# Patient Record
Sex: Female | Born: 1995 | Hispanic: Refuse to answer | Marital: Single | Smoking: Never smoker
Health system: Southern US, Community
[De-identification: ages and names within clinical notes are randomized; demographics above are authoritative.]

---
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
## 2017-02-18 ENCOUNTER — Ambulatory Visit (INDEPENDENT_AMBULATORY_CARE_PROVIDER_SITE_OTHER): Payer: Managed Care, Other (non HMO) | Admitting: Family Medicine

## 2017-02-18 ENCOUNTER — Encounter: Payer: Self-pay | Admitting: Family Medicine

## 2017-02-18 DIAGNOSIS — M79605 Pain in left leg: Secondary | ICD-10-CM

## 2017-02-26 ENCOUNTER — Other Ambulatory Visit: Payer: Self-pay | Admitting: Family Medicine

## 2017-02-26 DIAGNOSIS — M79605 Pain in left leg: Secondary | ICD-10-CM

## 2017-02-27 ENCOUNTER — Ambulatory Visit
Admission: RE | Admit: 2017-02-27 | Discharge: 2017-02-27 | Disposition: A | Discharge status: Home / Self Care | Payer: Commercial Managed Care - PPO | Source: Ambulatory Visit | Attending: Family Medicine | Admitting: Family Medicine

## 2017-02-27 DIAGNOSIS — M79605 Pain in left leg: Secondary | ICD-10-CM | POA: Diagnosis present

## 2017-02-27 DIAGNOSIS — Z9889 Other specified postprocedural states: Secondary | ICD-10-CM | POA: Diagnosis not present

## 2017-03-07 ENCOUNTER — Other Ambulatory Visit: Payer: Self-pay | Admitting: Family Medicine

## 2017-03-07 DIAGNOSIS — M79605 Pain in left leg: Secondary | ICD-10-CM

## 2017-03-08 ENCOUNTER — Ambulatory Visit
Admission: RE | Admit: 2017-03-08 | Discharge: 2017-03-08 | Disposition: A | Discharge status: Home / Self Care | Payer: Commercial Managed Care - PPO | Source: Ambulatory Visit | Attending: Family Medicine | Admitting: Family Medicine

## 2017-03-08 DIAGNOSIS — M79605 Pain in left leg: Secondary | ICD-10-CM | POA: Diagnosis present

## 2017-03-08 DIAGNOSIS — M25761 Osteophyte, right knee: Secondary | ICD-10-CM | POA: Diagnosis not present

## 2017-03-15 NOTE — Progress Notes (Signed)
Patient presents today with symptoms of pain along her surgical scar on her lower left leg from her anterior cruciate ligament surgery. Patient states that she has had pain for the last few days. She denies falling or injuring the area in any way. She states that the pain starts along the surgical scar and then goes down laterally to the mid lower leg area. She denies the symptoms going down into her foot. She denies any swelling of the lower leg or discoloration of the leg. She states that she feels the symptoms at rest as well as with activity. It does get worse when she does activity though. She denies any history of any stress injury in the past. She denies any lower back pain or thigh pain. She is unsure whether the anterior cruciate ligament brace she is wearing is contributing to her symptoms. The symptoms last for less than a minute and then go away and come back.  ROS: Negative except mentioned above. Vitals as per Epic. GENERAL: NAD RESP: CTA B CARD: RRR MSK: Left lower extremity - no obvious deformity, normal color, or surgical scar is raised and nontender, tapping on the scar causes pain along the lateral lower leg, full range of motion, negative Homans, positive pedal pulses NEURO: CN II-XII grossly intact   A/P: Left lower leg pain - symptoms suggestive of superficial nerve pain, unsure whether her brace is causing compression of the area, would recommend adjusting the brace to see if this resolves symptoms, will get x-rays of the lower leg, would consider using lidocaine patch or gel, will follow up with Dr. Ardine Engiehl when he is at [facility] next. Activity as tolerated. Follow-up if symptoms worsen as discussed.

## 2017-04-22 ENCOUNTER — Encounter: Payer: Self-pay | Admitting: Family Medicine

## 2017-04-22 ENCOUNTER — Ambulatory Visit (INDEPENDENT_AMBULATORY_CARE_PROVIDER_SITE_OTHER): Payer: PRIVATE HEALTH INSURANCE | Admitting: Family Medicine

## 2017-04-22 DIAGNOSIS — M79672 Pain in left foot: Secondary | ICD-10-CM

## 2017-04-22 NOTE — Progress Notes (Signed)
Patient presents today with symptoms of left foot pain for the past 4 weeks. Patient states that she noticed some pain on the dorsal aspect of her foot with some bruising and swelling approximately 4 weeks ago. She is unsure of any trauma to the foot at that time. She was able to practice and also play in a basketball games without any noticeable issues. She did not disclose any pain of the foot until after the season was over to her trainer. She states since basketball ended a few days ago her symptoms have improved. She denies any pain anywhere else. She denies any history of injury to that foot in the past. She does not wear orthotics. She does not run outside of normal basketball practice. Denies any significant weight changes or menstrual issues.she denies any pain with walking at this time.  ROS: Negative except mentioned above. Vitals as per Epic. GENERAL: NAD MSK: Left Foot - no obvious ecchymosis or swelling, mild tenderness along the dorsal aspect of the proximal fourth metatarsal area, full range of motion, 5 out of 5 strength, NV intact NEURO: CN II-XII grossly intact   A/P: Left Foot Pain - patient has no pain currently with walking, will do x-rays, encourage patient to rest from running at this time, patient has approximately 2 weeks off from basketball activity, if her x-rays are negative for any bony abnormality would suggest further imaging if her pain comes back when she returns to basketball activity in a few weeks, consider also doing vitamin D level.

## 2017-04-23 ENCOUNTER — Ambulatory Visit
Admission: RE | Admit: 2017-04-23 | Discharge: 2017-04-23 | Disposition: A | Discharge status: Home / Self Care | Payer: Commercial Managed Care - PPO | Source: Ambulatory Visit | Attending: Family Medicine | Admitting: Family Medicine

## 2017-04-23 DIAGNOSIS — M79672 Pain in left foot: Secondary | ICD-10-CM | POA: Diagnosis present

## 2017-12-11 IMAGING — CR DG KNEE COMPLETE 4+V*L*
1 series · 4 of 4 positions shown · non-contrast
Comparison: None in PACs

CLINICAL DATA: Left knee buckle during a basketball game last
night. The patient is experiencing pain and tightness posteriorly
and medially. It is painful to bear weight.

EXAM:
LEFT KNEE - COMPLETE 4+ VIEW

[Series 1: dg knee complete 4 views left · 0.14mm/px · 4 of 4 slices shown]
[im 1/4]
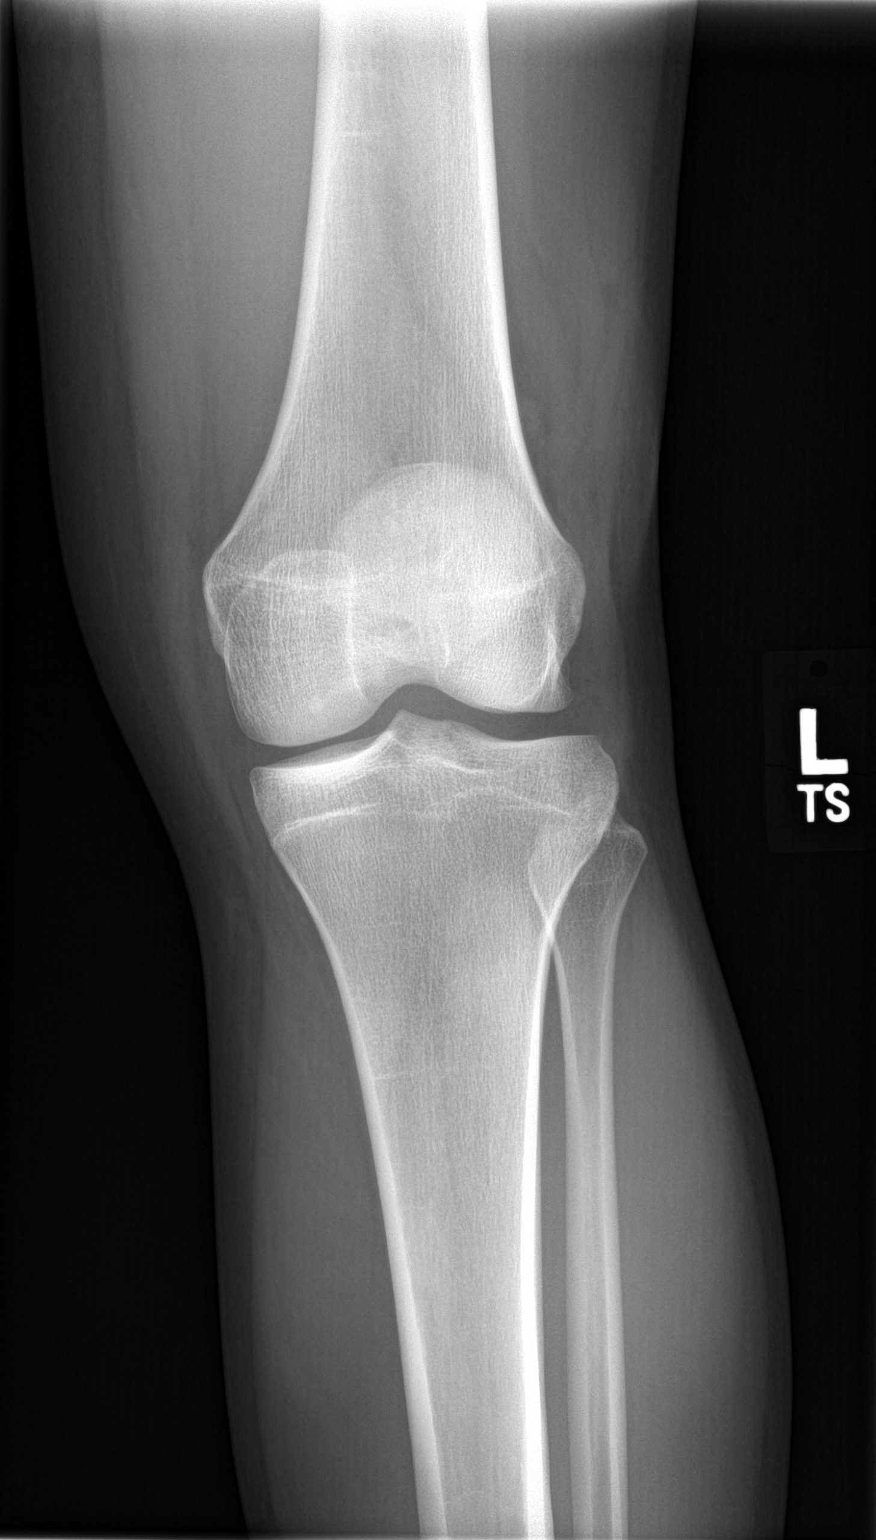
[im 2/4]
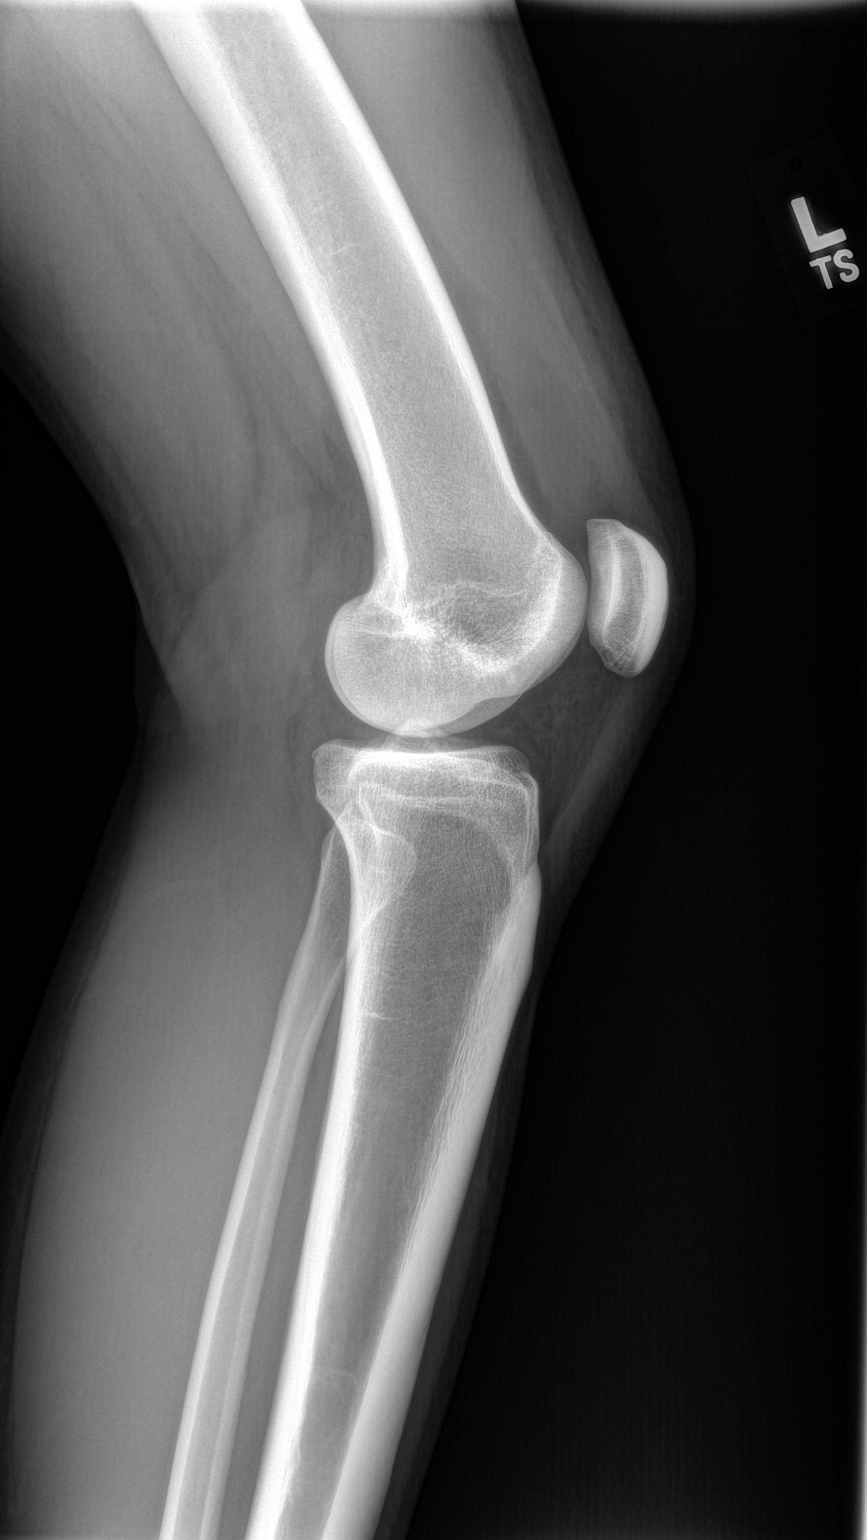
[im 3/4]
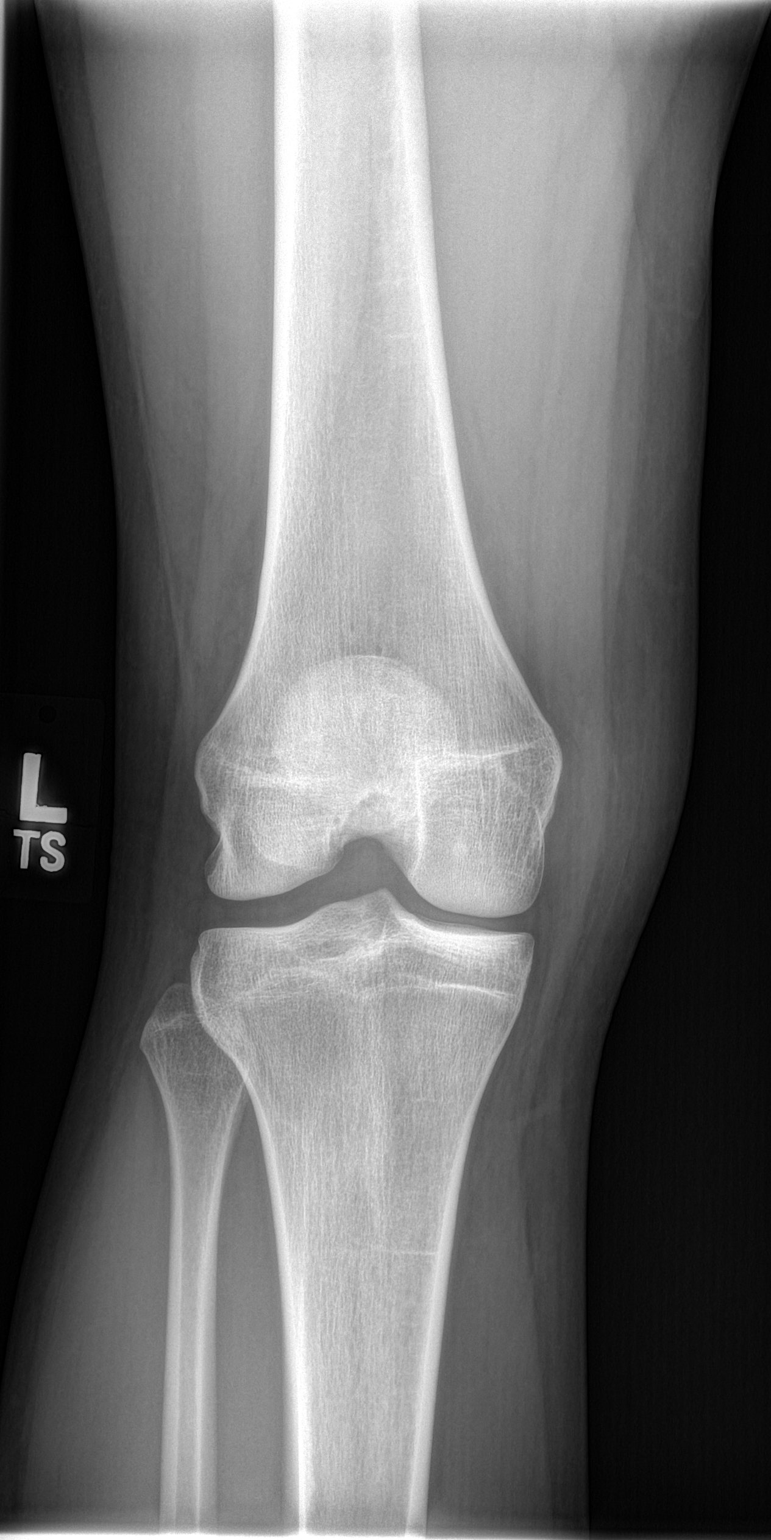
[im 4/4]
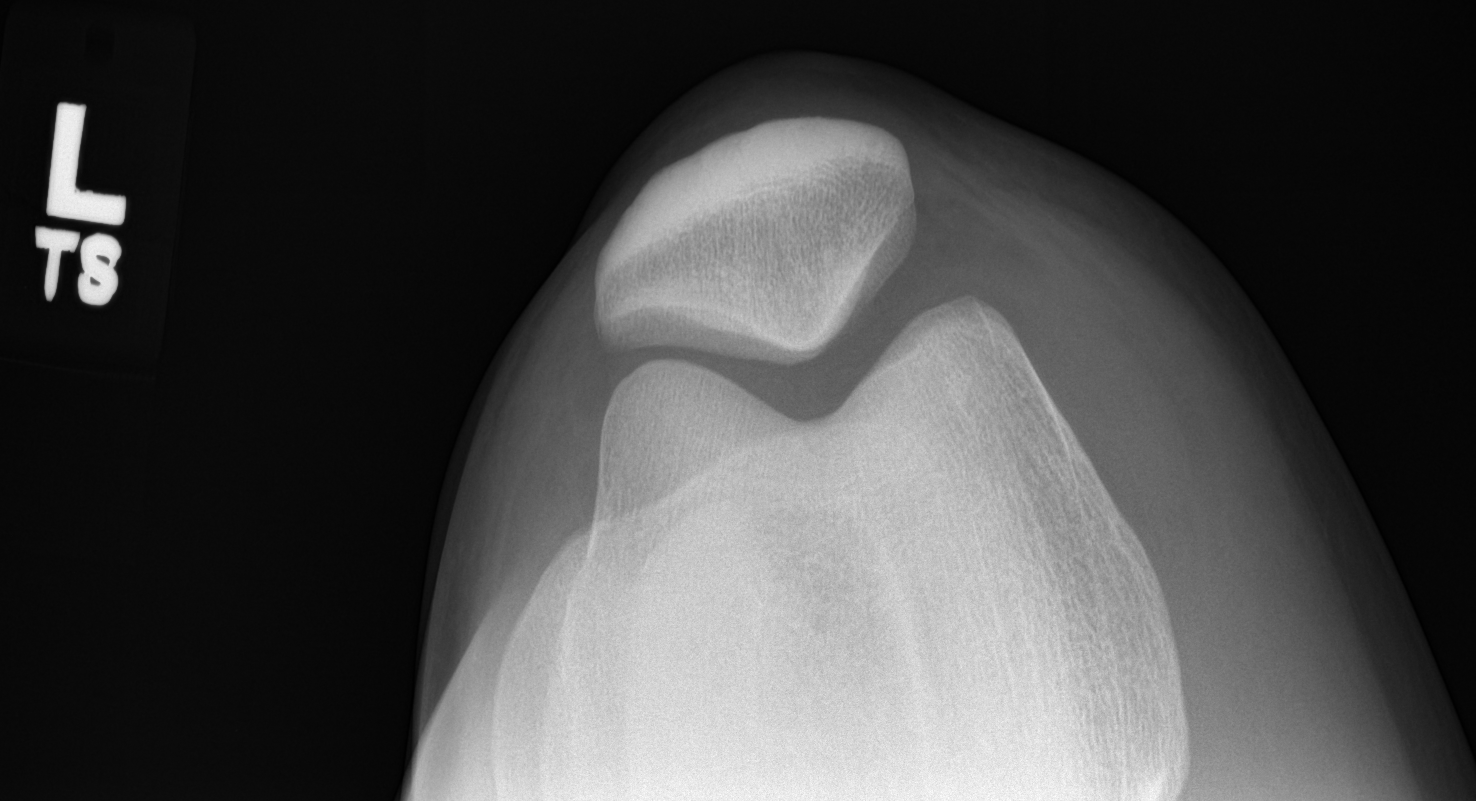

[4 of 4 positions shown; findings below may reference images not displayed]

FINDINGS: The bones are subjectively adequately mineralized. There is no acute
fracture or dislocation. There is likely a suprapatellar effusion.
The joint spaces are well maintained.
IMPRESSION: There is no acute fracture nor dislocation of the left knee. There
may be a small suprapatellar effusion.

## 2018-03-21 ENCOUNTER — Encounter: Payer: Self-pay | Admitting: Family Medicine

## 2018-03-21 ENCOUNTER — Ambulatory Visit (INDEPENDENT_AMBULATORY_CARE_PROVIDER_SITE_OTHER): Payer: Commercial Managed Care - PPO | Admitting: Family Medicine

## 2018-03-21 VITALS — BP 135/79 | HR 87 | Temp 97.9°F | Resp 14

## 2018-03-21 DIAGNOSIS — J029 Acute pharyngitis, unspecified: Secondary | ICD-10-CM

## 2018-03-21 LAB — CBC WITH DIFFERENTIAL/PLATELET
BASOS ABS: 0 10*3/uL (ref 0.0–0.2)
Basos: 0 %
EOS (ABSOLUTE): 0 10*3/uL (ref 0.0–0.4)
Eos: 0 %
Hematocrit: 38.2 % (ref 34.0–46.6)
Hemoglobin: 13.5 g/dL (ref 11.1–15.9)
Immature Grans (Abs): 0 10*3/uL (ref 0.0–0.1)
Immature Granulocytes: 0 %
LYMPHS ABS: 1.6 10*3/uL (ref 0.7–3.1)
Lymphs: 17 %
MCH: 31.9 pg (ref 26.6–33.0)
MCHC: 35.3 g/dL (ref 31.5–35.7)
MCV: 90 fL (ref 79–97)
Monocytes Absolute: 0.9 10*3/uL (ref 0.1–0.9)
Monocytes: 10 %
NEUTROS ABS: 6.5 10*3/uL (ref 1.4–7.0)
Neutrophils: 73 %
PLATELETS: 236 10*3/uL (ref 150–450)
RBC: 4.23 x10E6/uL (ref 3.77–5.28)
RDW: 11.5 % — ABNORMAL LOW (ref 11.7–15.4)
WBC: 9.1 10*3/uL (ref 3.4–10.8)

## 2018-03-21 LAB — POCT RAPID STREP A (OFFICE): Rapid Strep A Screen: NEGATIVE

## 2018-03-21 LAB — POCT INFLUENZA A/B
INFLUENZA B, POC: NEGATIVE
Influenza A, POC: NEGATIVE

## 2018-03-21 LAB — MONONUCLEOSIS SCREEN: MONO SCREEN: NEGATIVE

## 2018-03-21 NOTE — Progress Notes (Signed)
negative

## 2018-03-21 NOTE — Progress Notes (Signed)
Patient presents today with symptoms of sore throat for two days and night sweats last night.  Patient states that she woke up around 4 AM and took Aleve.  She denies any cough, abdominal pain, nausea, vomiting, diarrhea.  Patient does admit to some postnasal drip.  She denies ever having infectious mono.  She denies any sick contacts.  ROS: Negative except mentioned above. Vitals as per Epic. GENERAL: NAD HEENT: moderate pharyngeal erythema, no exudate, mild enlargement of tonsils, no erythema of TMs, shotty cervical LAD RESP: CTA B CARD: RRR ABD:, Nontender, no rebound or guarding appreciated, no organomegaly appreciated NEURO: CN II-XII grossly intact   A/P: Pharyngitis- rapid strep test and flu screen were negative in the office, CBC and Monospot test were drawn, continue NSAID/Tylenol as needed, ice, hydration, no athletic activity or class until afebrile for 24 hours without taking fever lowering medication.  Will discuss any change in treatment after lab results have been reviewed.  Athletic trainer was informed.

## 2018-08-08 ENCOUNTER — Other Ambulatory Visit: Payer: Self-pay | Admitting: *Deleted

## 2018-08-08 DIAGNOSIS — Z20822 Contact with and (suspected) exposure to covid-19: Secondary | ICD-10-CM

## 2018-08-11 ENCOUNTER — Other Ambulatory Visit: Payer: Self-pay

## 2018-08-11 DIAGNOSIS — Z20822 Contact with and (suspected) exposure to covid-19: Secondary | ICD-10-CM

## 2018-08-16 LAB — NOVEL CORONAVIRUS, NAA: SARS-CoV-2, NAA: NOT DETECTED

## 2018-08-20 ENCOUNTER — Other Ambulatory Visit: Payer: Self-pay | Admitting: Family Medicine

## 2018-08-23 LAB — 5+CRT-BUND
Amphetamines, Urine: NEGATIVE ng/mL
Cannabinoid Quant, Ur: NEGATIVE ng/mL
Cocaine (Metab.): NEGATIVE ng/mL
Creatinine, Urine: 34.5 mg/dL (ref 20.0–300.0)
OPIATE QUANTITATIVE URINE: NEGATIVE ng/mL
PCP Quant, Ur: NEGATIVE ng/mL
pH, Urine: 6.4 (ref 4.5–8.9)

## 2018-10-01 ENCOUNTER — Other Ambulatory Visit: Payer: Self-pay | Admitting: Family Medicine

## 2018-10-01 DIAGNOSIS — Z1159 Encounter for screening for other viral diseases: Secondary | ICD-10-CM

## 2018-10-03 LAB — NOVEL CORONAVIRUS, NAA: SARS-CoV-2, NAA: NOT DETECTED

## 2018-10-28 ENCOUNTER — Other Ambulatory Visit: Payer: Self-pay | Admitting: *Deleted

## 2018-10-28 DIAGNOSIS — Z20822 Contact with and (suspected) exposure to covid-19: Secondary | ICD-10-CM

## 2018-10-30 LAB — NOVEL CORONAVIRUS, NAA: SARS-CoV-2, NAA: NOT DETECTED

## 2019-02-20 ENCOUNTER — Telehealth: Payer: PRIVATE HEALTH INSURANCE | Admitting: Family Medicine

## 2019-03-08 ENCOUNTER — Other Ambulatory Visit: Payer: Self-pay | Admitting: Family Medicine

## 2019-03-08 MED ORDER — CLOTRIMAZOLE-BETAMETHASONE 1-0.05 % EX CREA: TOPICAL_CREAM | CUTANEOUS | 0 refills | Status: AC

## 2019-04-15 IMAGING — CR DG FOOT COMPLETE 3+V*L*
1 series · 3 of 3 positions shown · non-contrast
Comparison: None.

CLINICAL DATA: Foot pain for several weeks, no known injury,
initial encounter

EXAM:
LEFT FOOT - COMPLETE 3+ VIEW

[Series 1: dg foot complete left · 0.14mm/px · 3 of 3 slices shown]
[im 1/3]
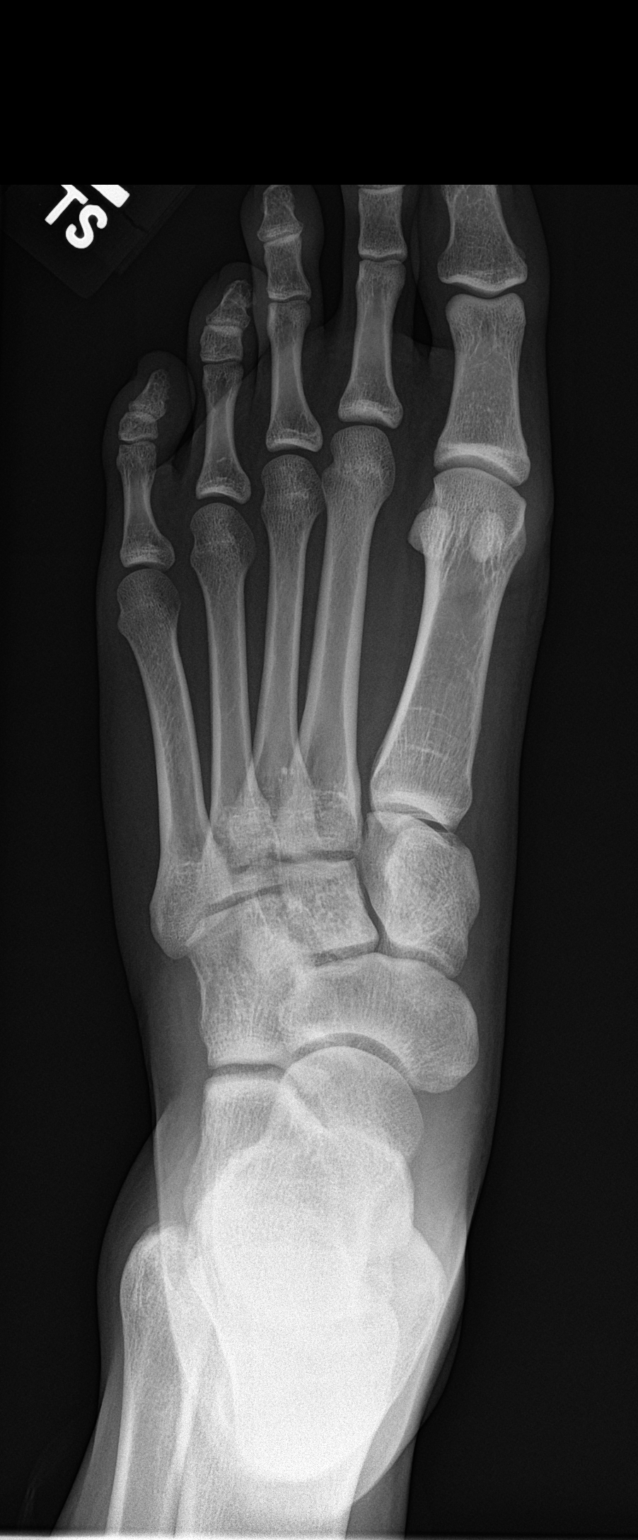
[im 2/3]
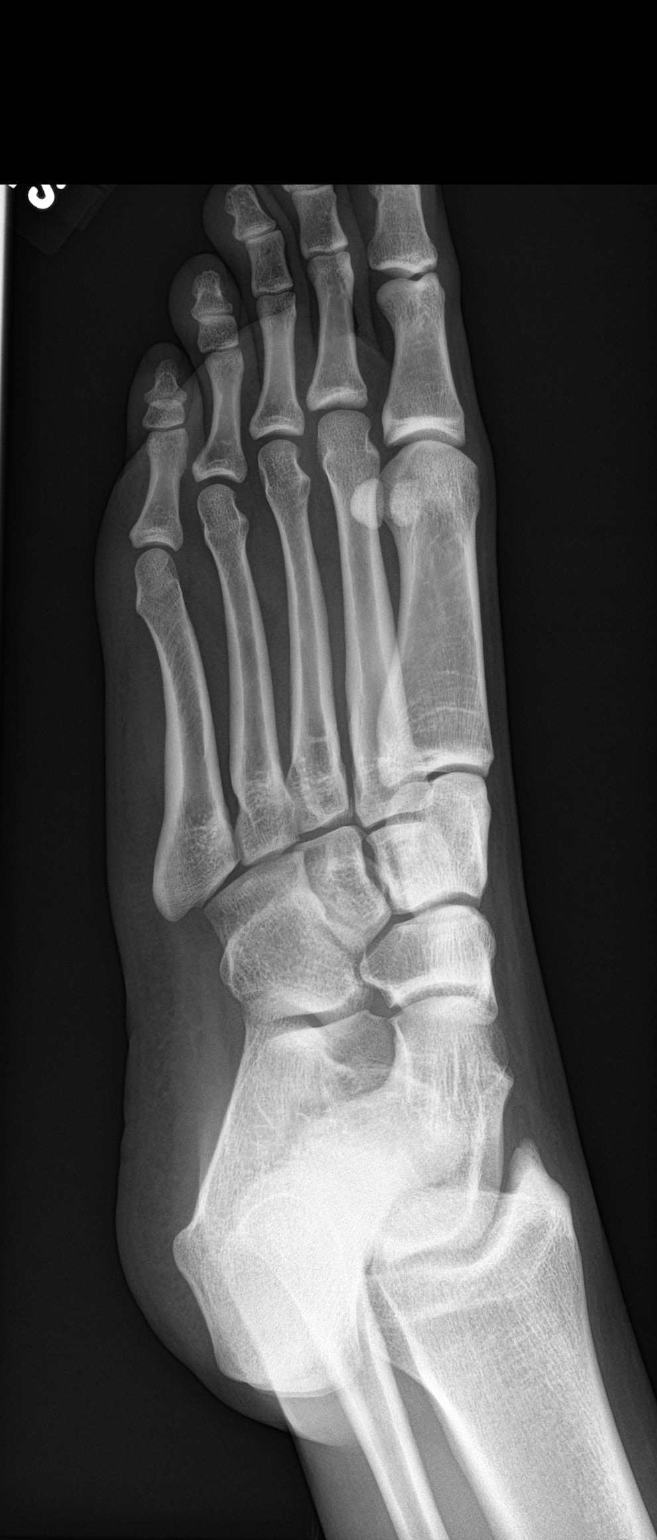
[im 3/3]
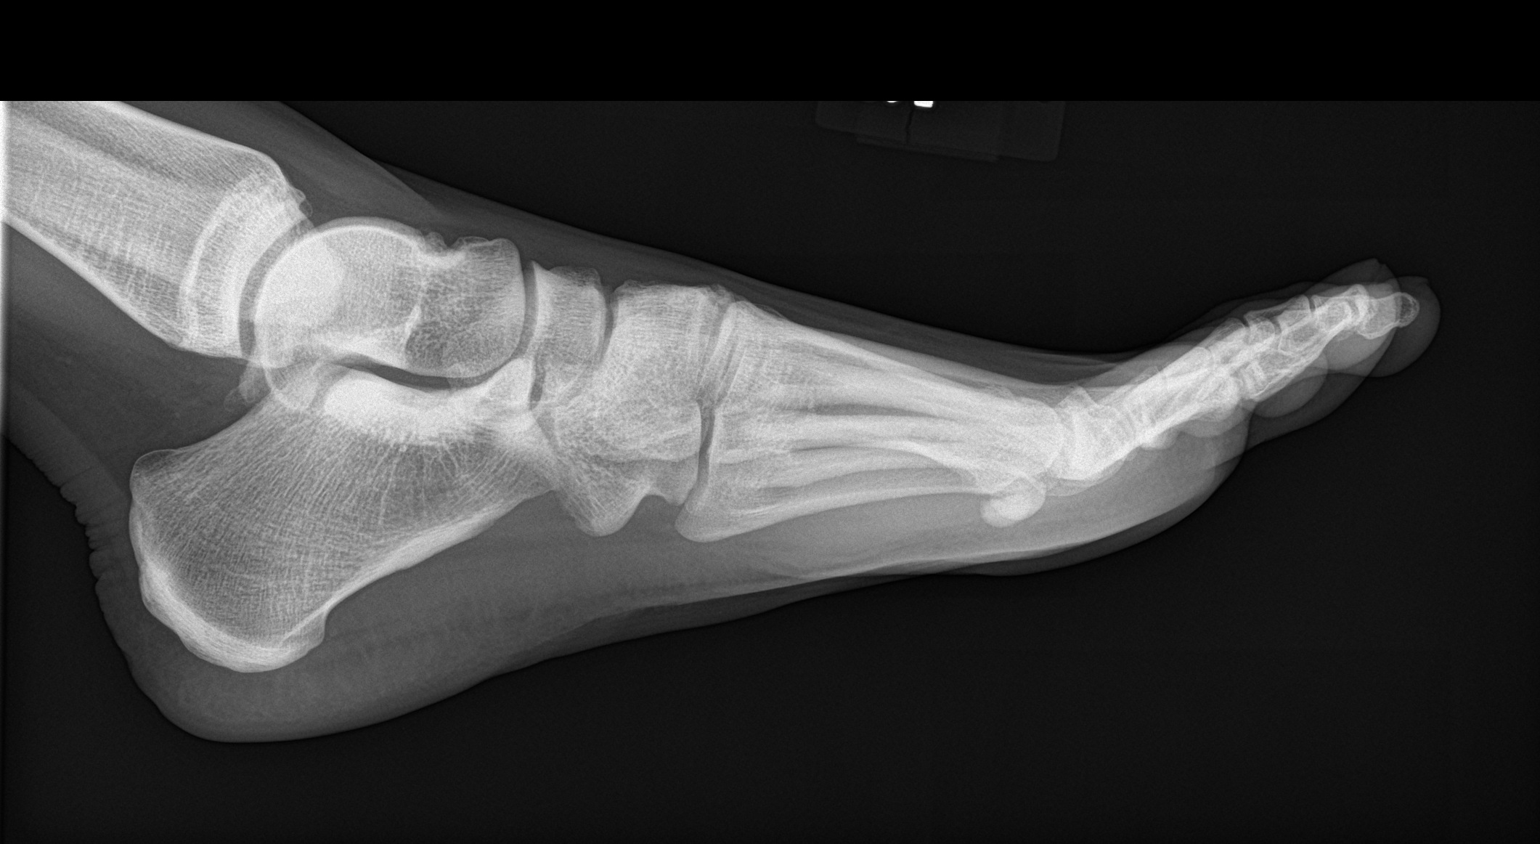

[3 of 3 positions shown; findings below may reference images not displayed]

FINDINGS: There is no evidence of fracture or dislocation. There is no
evidence of arthropathy or other focal bone abnormality. Soft
tissues are unremarkable.
IMPRESSION: No acute abnormality noted.
